# Patient Record
Sex: Female | Born: 1965 | Hispanic: Yes | Marital: Married | Smoking: Never smoker
Health system: Southern US, Community
[De-identification: ages and names within clinical notes are randomized; demographics above are authoritative.]

## PROBLEM LIST (undated history)

## (undated) DIAGNOSIS — I10 Essential (primary) hypertension: Secondary | ICD-10-CM

---

## 2019-10-26 ENCOUNTER — Encounter (HOSPITAL_COMMUNITY): Payer: Self-pay

## 2019-10-26 ENCOUNTER — Emergency Department (HOSPITAL_COMMUNITY)
Admission: EM | Admit: 2019-10-26 | Discharge: 2019-10-26 | Disposition: A | Discharge status: Home / Self Care | Payer: Self-pay | Attending: Emergency Medicine | Admitting: Emergency Medicine

## 2019-10-26 ENCOUNTER — Other Ambulatory Visit: Payer: Self-pay

## 2019-10-26 ENCOUNTER — Emergency Department (HOSPITAL_COMMUNITY): Payer: Self-pay

## 2019-10-26 DIAGNOSIS — I10 Essential (primary) hypertension: Secondary | ICD-10-CM | POA: Insufficient documentation

## 2019-10-26 DIAGNOSIS — R1011 Right upper quadrant pain: Secondary | ICD-10-CM | POA: Insufficient documentation

## 2019-10-26 HISTORY — DX: Essential (primary) hypertension: I10

## 2019-10-26 LAB — COMPREHENSIVE METABOLIC PANEL
ALT: 18 U/L (ref 0–44)
AST: 23 U/L (ref 15–41)
Albumin: 4 g/dL (ref 3.5–5.0)
Alkaline Phosphatase: 65 U/L (ref 38–126)
Anion gap: 9 (ref 5–15)
BUN: 16 mg/dL (ref 6–20)
CO2: 24 mmol/L (ref 22–32)
Calcium: 9.2 mg/dL (ref 8.9–10.3)
Chloride: 107 mmol/L (ref 98–111)
Creatinine, Ser: 1.01 mg/dL — ABNORMAL HIGH (ref 0.44–1.00)
GFR calc Af Amer: >60 mL/min (ref >60)
GFR calc non Af Amer: >60 mL/min (ref >60)
Glucose, Bld: 108 mg/dL — ABNORMAL HIGH (ref 70–99)
Potassium: 3.9 mmol/L (ref 3.5–5.1)
Sodium: 140 mmol/L (ref 135–145)
Total Bilirubin: 0.7 mg/dL (ref 0.3–1.2)
Total Protein: 7.6 g/dL (ref 6.5–8.1)

## 2019-10-26 LAB — CBC
HCT: 41.4 % (ref 36.0–46.0)
Hemoglobin: 13.9 g/dL (ref 12.0–15.0)
MCH: 30.8 pg (ref 26.0–34.0)
MCHC: 33.6 g/dL (ref 30.0–36.0)
MCV: 91.6 fL (ref 80.0–100.0)
Platelets: 298 10*3/uL (ref 150–400)
RBC: 4.52 MIL/uL (ref 3.87–5.11)
RDW: 13.1 % (ref 11.5–15.5)
WBC: 16 10*3/uL — ABNORMAL HIGH (ref 4.0–10.5)
nRBC: 0 % (ref 0.0–0.2)

## 2019-10-26 LAB — URINALYSIS, ROUTINE W REFLEX MICROSCOPIC
Bilirubin Urine: NEGATIVE
Glucose, UA: NEGATIVE mg/dL
Ketones, ur: 5 mg/dL — AB
Leukocytes,Ua: NEGATIVE
Nitrite: NEGATIVE
Protein, ur: 30 mg/dL — AB
Specific Gravity, Urine: 1.033 — ABNORMAL HIGH (ref 1.005–1.030)
pH: 5 (ref 5.0–8.0)

## 2019-10-26 LAB — LIPASE, BLOOD: Lipase: 32 U/L (ref 11–51)

## 2019-10-26 LAB — I-STAT BETA HCG BLOOD, ED (MC, WL, AP ONLY): I-stat hCG, quantitative: <5 m[IU]/mL (ref <5)

## 2019-10-26 MED ORDER — MORPHINE SULFATE (PF) 4 MG/ML IV SOLN
4.0000 mg | Freq: Once | INTRAVENOUS | Status: AC
  Administered 2019-10-26: 4 mg via INTRAVENOUS
  Filled 2019-10-26: qty 1

## 2019-10-26 MED ORDER — TRAMADOL HCL 50 MG PO TABS: 50.0000 mg | ORAL_TABLET | Freq: Four times a day (QID) | ORAL | 0 refills | Status: AC | PRN

## 2019-10-26 MED ORDER — ONDANSETRON HCL 4 MG/2ML IJ SOLN
4.0000 mg | Freq: Once | INTRAMUSCULAR | Status: AC
  Administered 2019-10-26: 4 mg via INTRAVENOUS
  Filled 2019-10-26: qty 2

## 2019-10-26 MED ORDER — OMEPRAZOLE 20 MG PO CPDR: 20.0000 mg | DELAYED_RELEASE_CAPSULE | Freq: Two times a day (BID) | ORAL | 0 refills | Status: AC

## 2019-10-26 MED ORDER — SODIUM CHLORIDE 0.9 % IV BOLUS
1000.0000 mL | Freq: Once | INTRAVENOUS | Status: AC
  Administered 2019-10-26: 1000 mL via INTRAVENOUS

## 2019-10-26 NOTE — ED Triage Notes (Signed)
Pt arrives POV for eval of RUQ pain w/ nausea onset yesterday. States worse after eating

## 2019-10-26 NOTE — Discharge Instructions (Signed)
Begin taking omeprazole as prescribed.  Take tramadol as prescribed as needed for pain.  Follow-up with gastroenterology if your symptoms are not improving in the next week.  The contact information for [facility] has been provided in this discharge summary for you to call and make these arrangements.  Return to the ER in the meantime if you develop worsening pain, high fever, bloody stool, or other new and concerning symptoms.

## 2019-10-26 NOTE — ED Provider Notes (Signed)
[facility] EMERGENCY DEPARTMENT Provider Note   CSN: [id number] Arrival date & time: 10/26/19  1434     History Chief Complaint  Patient presents with  . Abdominal Pain    Annette Horne is a 54 y.o. female.  HistoryPatient is a 54 year old female of hypertension. She presents with complaints of epigastric and right upper quadrant abdominal pain that began yesterday. This started after eating and is worse with eating or drinking. She denies fevers or chills. She denies any bloody stools. She reports nausea, but has not vomited. Only prior surgery is appendectomy 35 years ago.  The history is provided by the patient.  Abdominal Pain Pain location:  Epigastric and RUQ Pain quality: cramping   Pain radiates to:  RUQ and epigastric region Pain severity:  Moderate Onset quality:  Sudden Duration:  24 hours Timing:  Constant Progression:  Worsening Chronicity:  New Relieved by:  Nothing Worsened by:  Movement, palpation and eating Ineffective treatments:  None tried Associated symptoms: no diarrhea, no fatigue, no fever and no vomiting        Past Medical History:  Diagnosis Date  . Hypertension     There are no problems to display for this patient.   History reviewed. No pertinent surgical history.   OB History   No obstetric history on file.     No family history on file.  Social History   Tobacco Use  . Smoking status: Never Smoker  . Smokeless tobacco: Never Used  Substance Use Topics  . Alcohol use: Yes  . Drug use: Not Currently    Home Medications Prior to Admission medications   Not on File    Allergies    Patient has no known allergies.  Review of Systems   Review of Systems  Constitutional: Negative for fatigue and fever.  Gastrointestinal: Positive for abdominal pain. Negative for diarrhea and vomiting.  All other systems reviewed and are negative.   Physical Exam Updated Vital Signs BP 118/76 (BP  Location: Left Arm)   Pulse 60   Temp 98.4 F (36.9 C) (Oral)   Resp 17   Ht 5' 4.96" (1.65 m)   Wt 63.5 kg   SpO2 100%   BMI 23.33 kg/m   Physical Exam Vitals and nursing note reviewed.  Constitutional:      General: She is not in acute distress.    Appearance: She is well-developed. She is not diaphoretic.  HENT:     Head: Normocephalic and atraumatic.  Cardiovascular:     Rate and Rhythm: Normal rate and regular rhythm.     Heart sounds: No murmur heard.  No friction rub. No gallop.   Pulmonary:     Effort: Pulmonary effort is normal. No respiratory distress.     Breath sounds: Normal breath sounds. No wheezing.  Abdominal:     General: Bowel sounds are normal. There is no distension.     Palpations: Abdomen is soft.     Tenderness: There is abdominal tenderness in the right upper quadrant and epigastric area. There is no right CVA tenderness, left CVA tenderness, guarding or rebound.  Musculoskeletal:        General: Normal range of motion.     Cervical back: Normal range of motion and neck supple.  Skin:    General: Skin is warm and dry.  Neurological:     Mental Status: She is alert and oriented to person, place, and time.     ED Results / Procedures /  Treatments   Labs (all labs ordered are listed, but only abnormal results are displayed) Labs Reviewed  COMPREHENSIVE METABOLIC PANEL - Abnormal; Notable for the following components:      Result Value   Glucose, Bld 108 (*)    Creatinine, Ser 1.01 (*)    All other components within normal limits  CBC - Abnormal; Notable for the following components:   WBC 16.0 (*)    All other components within normal limits  URINALYSIS, ROUTINE W REFLEX MICROSCOPIC - Abnormal; Notable for the following components:   Color, Urine AMBER (*)    APPearance CLOUDY (*)    Specific Gravity, Urine 1.033 (*)    Hgb urine dipstick SMALL (*)    Ketones, ur 5 (*)    Protein, ur 30 (*)    Bacteria, UA RARE (*)    All other  components within normal limits  LIPASE, BLOOD  I-STAT BETA HCG BLOOD, ED ([facility], [facility], [facility] ONLY)    EKG None  Radiology No results found.  Procedures Procedures (including critical care time)  Medications Ordered in ED Medications  sodium chloride 0.9 % bolus 1,000 mL (has no administration in time range)  ondansetron (ZOFRAN) injection 4 mg (has no administration in time range)  morphine 4 MG/ML injection 4 mg (has no administration in time range)    ED Course  I have reviewed the triage vital signs and the nursing notes.  Pertinent labs & imaging results that were available during my care of the patient were reviewed by me and considered in my medical decision making (see chart for details).    MDM Rules/Calculators/A&P  Patient presents with complaints of RUQ/epigastric pain that started yesterday.  On exam, she is tender in the right upper quadrant and epigastric region.  There are no peritoneal signs.  Patient arrives here with stable vital signs and no fever.  Patient work-up initiated including CBC, comprehensive metabolic panel, lipase.  All of these are unremarkable with the exception of a white count of 16,000.  Ultrasound shows no evidence for cholecystitis or cholelithiasis.  Patient feeling better after fluids and morphine.  At this point, I feel as though discharge is appropriate.  Patient will be discharged with omeprazole for presumed gastritis along with tramadol as needed for pain.  She is to follow-up with GI if not improving and return to the ER if symptoms worsen or change.  Final Clinical Impression(s) / ED Diagnoses Final diagnoses:  RUQ pain    Rx / DC Orders ED Discharge Orders    None       Veryl Speak, MD 10/26/19 2306

## 2019-10-26 NOTE — ED Notes (Signed)
Discharge instructions discussed with pt pt verbalized understanding with no questions at this time. Pt to go home at this time.

## 2021-06-20 IMAGING — US US ABDOMEN LIMITED
1 series · 14 of 25 positions shown · non-contrast
Comparison: None.

CLINICAL DATA: Right upper quadrant pain. Epigastric pain and
nausea. Symptom onset yesterday.

EXAM:
ULTRASOUND ABDOMEN LIMITED RIGHT UPPER QUADRANT

[Series 1: us abdomen limited ruq · 14 of 25 slices shown]
[im 1/25]
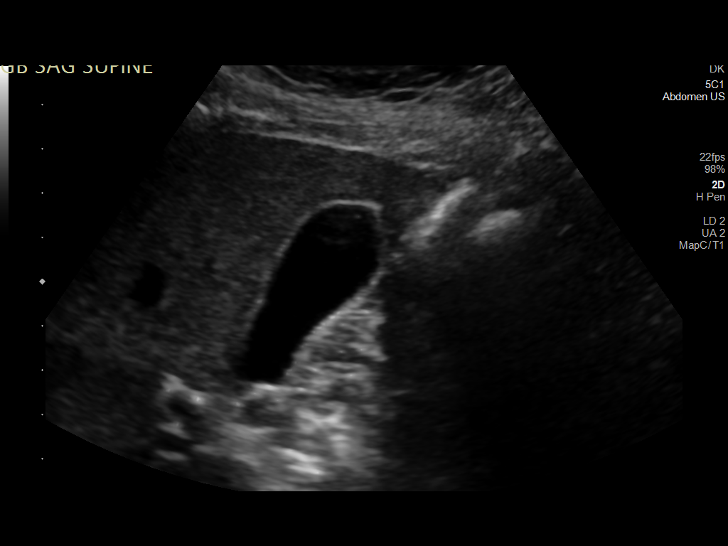
[im 3/25]
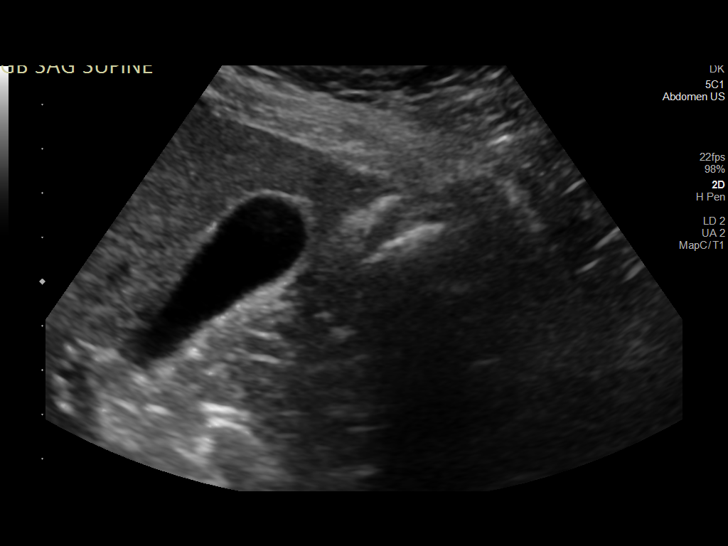
[im 5/25]
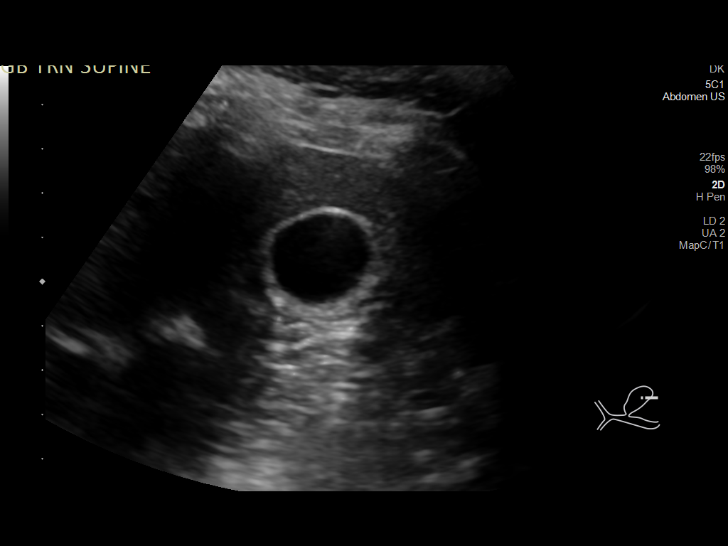
[im 7/25]
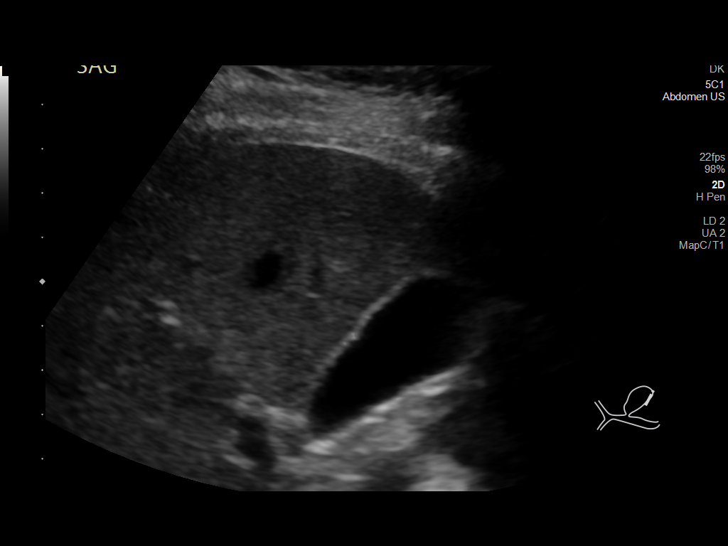
[im 9/25]
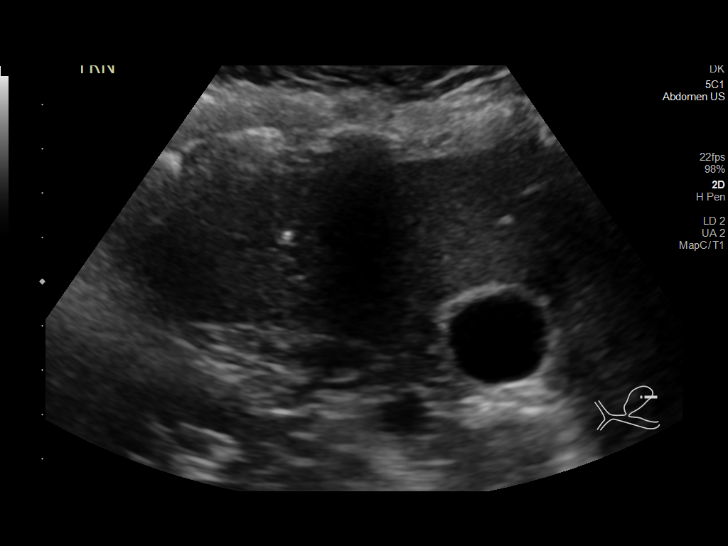
[im 10/25]
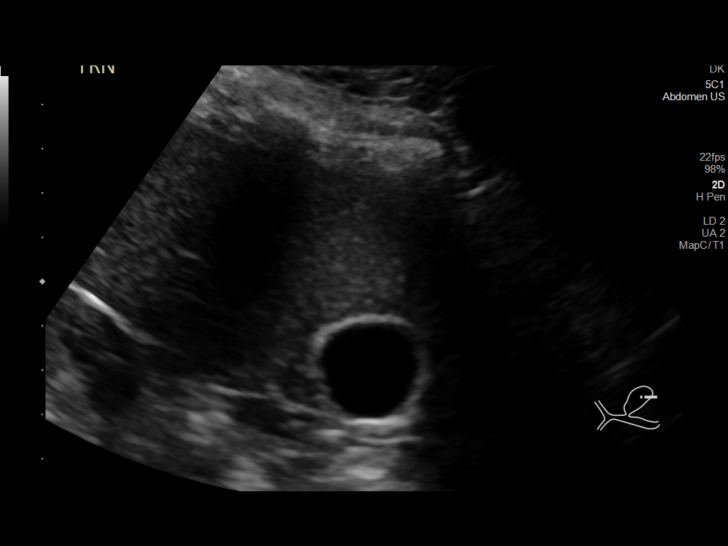
[im 12/25]
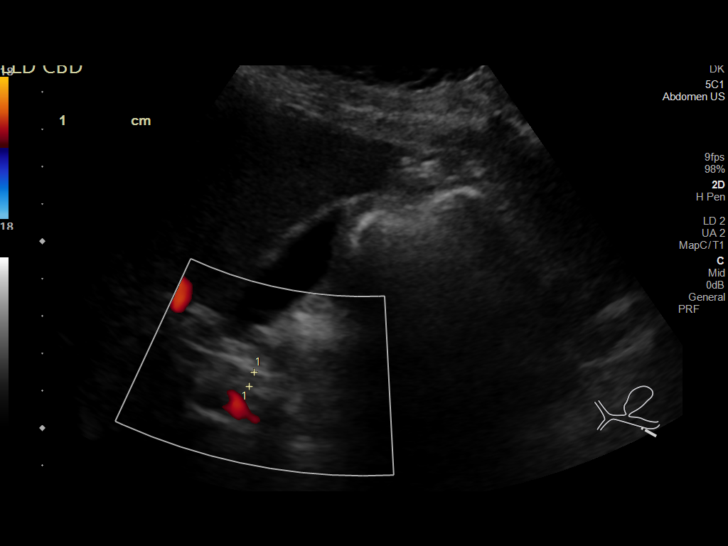
[im 14/25]
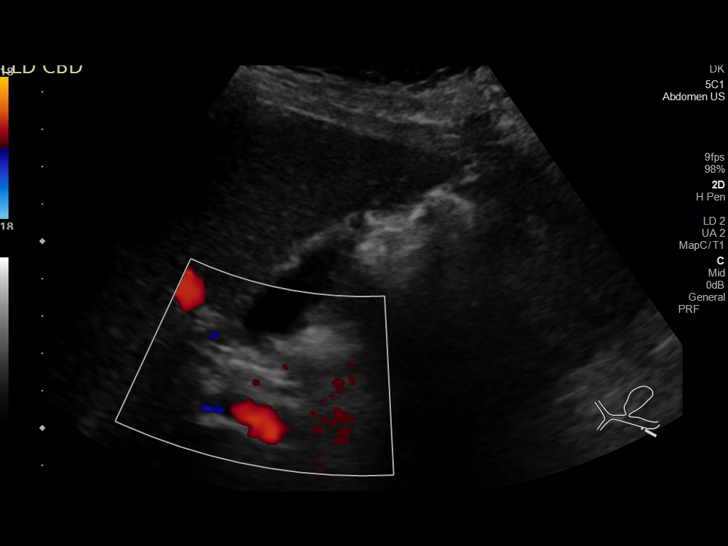
[im 16/25]
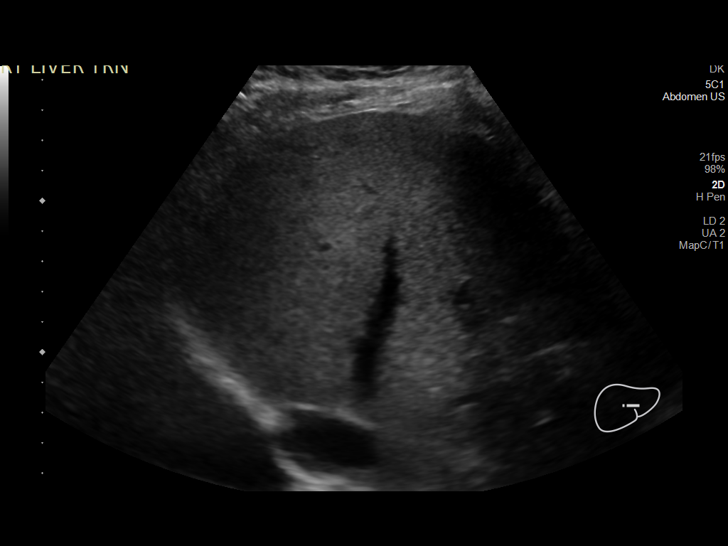
[im 17/25]
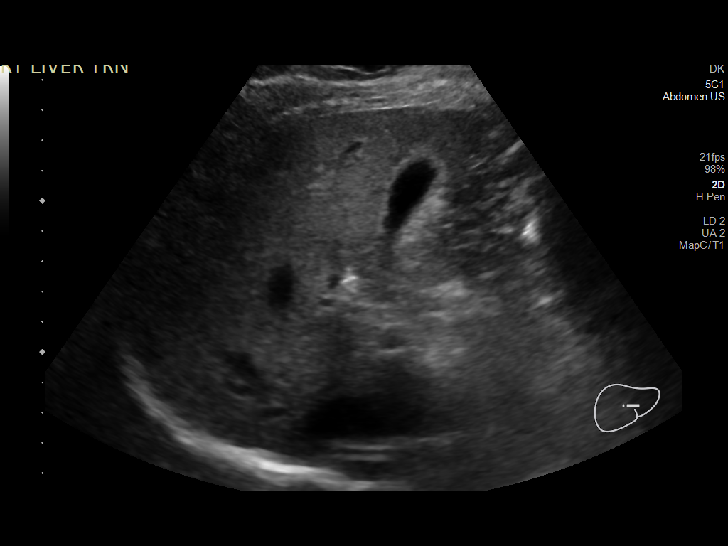
[im 19/25]
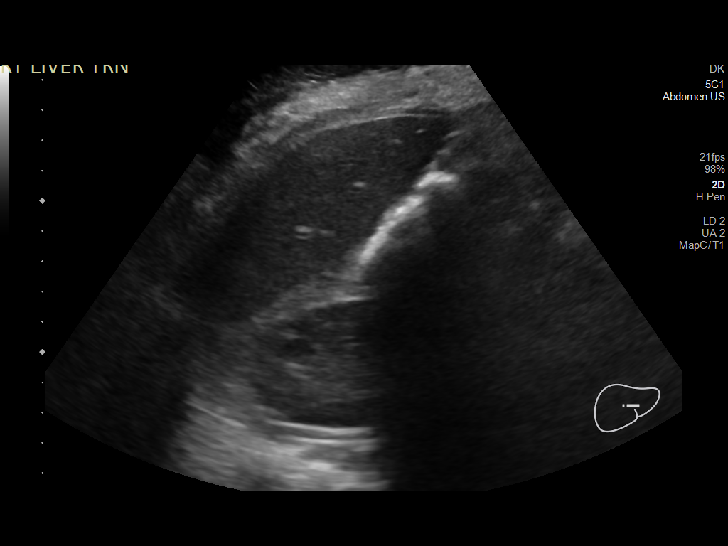
[im 21/25]
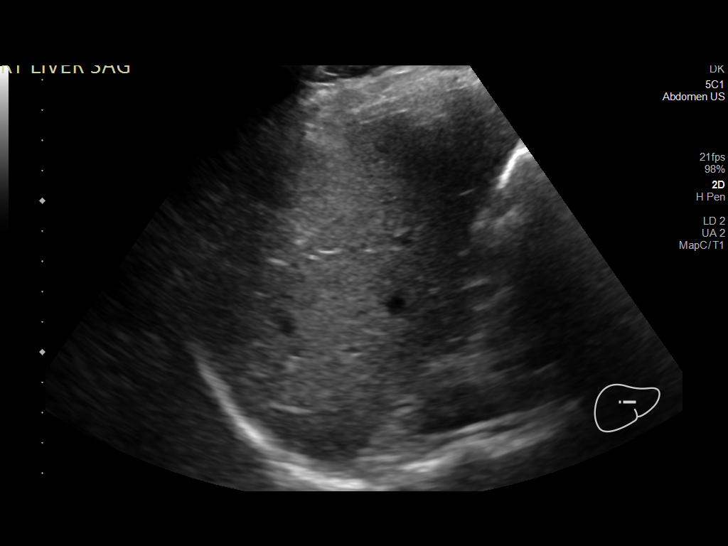
[im 23/25]
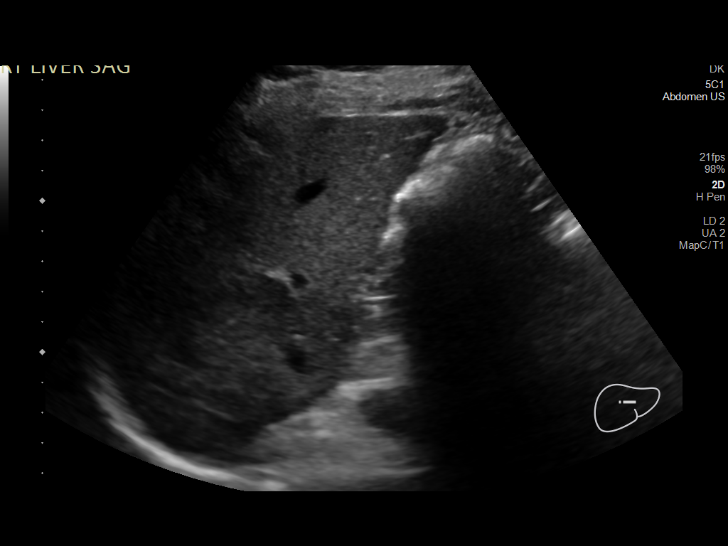
[im 25/25]
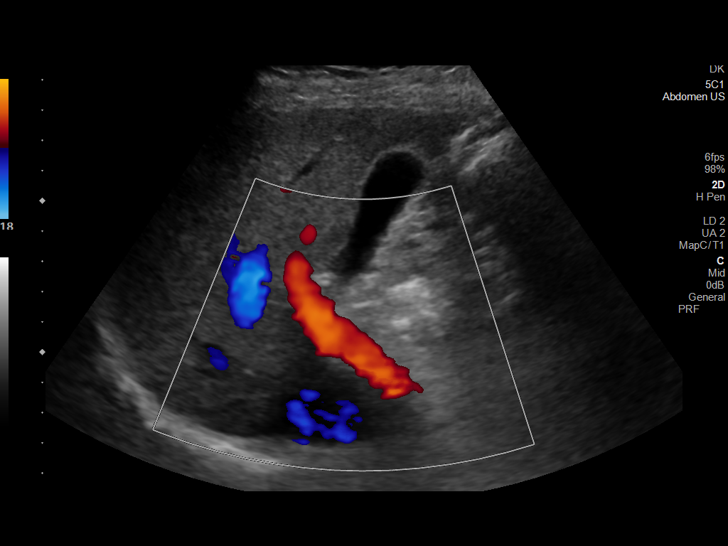

[14 of 25 positions shown; findings below may reference images not displayed]

FINDINGS: Gallbladder:

Physiologically distended. No gallstones or wall thickening
visualized. No sonographic Murphy sign noted by sonographer.

Common bile duct:

Diameter: 4 mm.

Liver:

No focal lesion identified. Heterogeneous in parenchymal
echogenicity. Portal vein is patent on color Doppler imaging with
normal direction of blood flow towards the liver.

Other: None.
IMPRESSION: Normal sonographic appearance of the gallbladder and biliary tree.
